# Patient Record
Sex: Female | Born: 1995 | Race: Black or African American | Hispanic: No | Marital: Single | Smoking: Never smoker
Health system: Southern US, Community
[De-identification: ages and names within clinical notes are randomized; demographics above are authoritative.]

## PROBLEM LIST (undated history)

## (undated) DIAGNOSIS — J45909 Unspecified asthma, uncomplicated: Secondary | ICD-10-CM

## (undated) DIAGNOSIS — I319 Disease of pericardium, unspecified: Secondary | ICD-10-CM

## (undated) DIAGNOSIS — J302 Other seasonal allergic rhinitis: Secondary | ICD-10-CM

## (undated) HISTORY — PX: OTHER SURGICAL HISTORY: SHX169

## (undated) HISTORY — DX: Disease of pericardium, unspecified: I31.9

---

## 2016-05-20 ENCOUNTER — Encounter (HOSPITAL_COMMUNITY): Payer: Self-pay

## 2016-05-20 ENCOUNTER — Emergency Department (HOSPITAL_COMMUNITY): Payer: Federal, State, Local not specified - PPO

## 2016-05-20 ENCOUNTER — Observation Stay (HOSPITAL_COMMUNITY)
Admission: EM | Admit: 2016-05-20 | Discharge: 2016-05-21 | Disposition: A | Discharge status: Home / Self Care | Payer: Federal, State, Local not specified - PPO | Attending: Internal Medicine | Admitting: Internal Medicine

## 2016-05-20 DIAGNOSIS — R079 Chest pain, unspecified: Secondary | ICD-10-CM

## 2016-05-20 DIAGNOSIS — I309 Acute pericarditis, unspecified: Secondary | ICD-10-CM | POA: Diagnosis not present

## 2016-05-20 DIAGNOSIS — I319 Disease of pericardium, unspecified: Secondary | ICD-10-CM

## 2016-05-20 DIAGNOSIS — D649 Anemia, unspecified: Secondary | ICD-10-CM | POA: Diagnosis not present

## 2016-05-20 DIAGNOSIS — R9431 Abnormal electrocardiogram [ECG] [EKG]: Secondary | ICD-10-CM | POA: Diagnosis present

## 2016-05-20 DIAGNOSIS — R778 Other specified abnormalities of plasma proteins: Secondary | ICD-10-CM | POA: Diagnosis not present

## 2016-05-20 DIAGNOSIS — I409 Acute myocarditis, unspecified: Secondary | ICD-10-CM

## 2016-05-20 DIAGNOSIS — R7989 Other specified abnormal findings of blood chemistry: Secondary | ICD-10-CM | POA: Diagnosis not present

## 2016-05-20 DIAGNOSIS — R1013 Epigastric pain: Secondary | ICD-10-CM | POA: Diagnosis present

## 2016-05-20 HISTORY — DX: Disease of pericardium, unspecified: I31.9

## 2016-05-20 HISTORY — DX: Unspecified asthma, uncomplicated: J45.909

## 2016-05-20 HISTORY — DX: Other seasonal allergic rhinitis: J30.2

## 2016-05-20 LAB — URINALYSIS, ROUTINE W REFLEX MICROSCOPIC
BILIRUBIN URINE: NEGATIVE
Bacteria, UA: NONE SEEN
GLUCOSE, UA: NEGATIVE mg/dL
HGB URINE DIPSTICK: NEGATIVE
Ketones, ur: 20 mg/dL — AB
LEUKOCYTES UA: NEGATIVE
NITRITE: NEGATIVE
PH: 5 (ref 5.0–8.0)
Protein, ur: 30 mg/dL — AB
Specific Gravity, Urine: 1.021 (ref 1.005–1.030)

## 2016-05-20 LAB — BASIC METABOLIC PANEL
Anion gap: 11 (ref 5–15)
BUN: 9 mg/dL (ref 6–20)
CHLORIDE: 104 mmol/L (ref 101–111)
CO2: 24 mmol/L (ref 22–32)
CREATININE: 0.82 mg/dL (ref 0.44–1.00)
Calcium: 9.2 mg/dL (ref 8.9–10.3)
GFR calc Af Amer: >60 mL/min (ref >60)
GFR calc non Af Amer: >60 mL/min (ref >60)
Glucose, Bld: 89 mg/dL (ref 65–99)
POTASSIUM: 3.3 mmol/L — AB (ref 3.5–5.1)
Sodium: 139 mmol/L (ref 135–145)

## 2016-05-20 LAB — I-STAT BETA HCG BLOOD, ED (MC, WL, AP ONLY): I-stat hCG, quantitative: <5 m[IU]/mL (ref <5)

## 2016-05-20 LAB — CBC
HEMATOCRIT: 39.8 % (ref 36.0–46.0)
HEMOGLOBIN: 13.2 g/dL (ref 12.0–15.0)
MCH: 29.5 pg (ref 26.0–34.0)
MCHC: 33.2 g/dL (ref 30.0–36.0)
MCV: 88.8 fL (ref 78.0–100.0)
PLATELETS: 240 10*3/uL (ref 150–400)
RBC: 4.48 MIL/uL (ref 3.87–5.11)
RDW: 11.9 % (ref 11.5–15.5)
WBC: 3.5 10*3/uL — AB (ref 4.0–10.5)

## 2016-05-20 LAB — D-DIMER, QUANTITATIVE: D-Dimer, Quant: 7.35 ug/mL-FEU — ABNORMAL HIGH (ref 0.00–0.50)

## 2016-05-20 LAB — I-STAT TROPONIN, ED: Troponin i, poc: 0.44 ng/mL (ref 0.00–0.08)

## 2016-05-20 LAB — MAGNESIUM: Magnesium: 2.2 mg/dL (ref 1.7–2.4)

## 2016-05-20 LAB — TROPONIN I
Troponin I: 1.12 ng/mL (ref <0.03)
Troponin I: 1.58 ng/mL (ref <0.03)

## 2016-05-20 MED ORDER — ONDANSETRON HCL 4 MG/2ML IJ SOLN
4.0000 mg | Freq: Four times a day (QID) | INTRAMUSCULAR | Status: DC | PRN
  Administered 2016-05-20: 4 mg via INTRAVENOUS
  Filled 2016-05-20: qty 2

## 2016-05-20 MED ORDER — KETOROLAC TROMETHAMINE 30 MG/ML IJ SOLN
30.0000 mg | Freq: Three times a day (TID) | INTRAMUSCULAR | Status: DC
  Administered 2016-05-20 – 2016-05-21 (×3): 30 mg via INTRAVENOUS
  Filled 2016-05-20 (×3): qty 1

## 2016-05-20 MED ORDER — ASPIRIN 81 MG PO CHEW
324.0000 mg | CHEWABLE_TABLET | Freq: Once | ORAL | Status: AC
  Administered 2016-05-20: 324 mg via ORAL
  Filled 2016-05-20: qty 4

## 2016-05-20 MED ORDER — ACETAMINOPHEN 325 MG PO TABS: 650.0000 mg | ORAL_TABLET | ORAL | Status: DC | PRN

## 2016-05-20 MED ORDER — POTASSIUM CHLORIDE CRYS ER 20 MEQ PO TBCR
40.0000 meq | EXTENDED_RELEASE_TABLET | Freq: Once | ORAL | Status: AC
  Administered 2016-05-20: 40 meq via ORAL
  Filled 2016-05-20: qty 2

## 2016-05-20 MED ORDER — IOPAMIDOL (ISOVUE-370) INJECTION 76%
INTRAVENOUS | Status: AC
  Administered 2016-05-20: 100 mL
  Filled 2016-05-20: qty 100

## 2016-05-20 MED ORDER — POTASSIUM CHLORIDE CRYS ER 20 MEQ PO TBCR
20.0000 meq | EXTENDED_RELEASE_TABLET | Freq: Two times a day (BID) | ORAL | Status: DC
  Administered 2016-05-20 – 2016-05-21 (×2): 20 meq via ORAL
  Filled 2016-05-20 (×3): qty 1

## 2016-05-20 MED ORDER — IBUPROFEN 600 MG PO TABS
600.0000 mg | ORAL_TABLET | Freq: Three times a day (TID) | ORAL | Status: DC | PRN
  Administered 2016-05-20 – 2016-05-21 (×2): 600 mg via ORAL
  Filled 2016-05-20 (×2): qty 1

## 2016-05-20 MED ORDER — SODIUM CHLORIDE 0.9 % IV SOLN
INTRAVENOUS | Status: DC
  Administered 2016-05-20 – 2016-05-21 (×3): via INTRAVENOUS

## 2016-05-20 MED ORDER — IBUPROFEN 100 MG PO CHEW
200.0000 mg | CHEWABLE_TABLET | Freq: Three times a day (TID) | ORAL | Status: DC | PRN
  Filled 2016-05-20: qty 2

## 2016-05-20 MED ORDER — ZOLPIDEM TARTRATE 5 MG PO TABS: 5.0000 mg | ORAL_TABLET | Freq: Every evening | ORAL | Status: DC | PRN

## 2016-05-20 MED ORDER — IBUPROFEN 200 MG PO TABS
200.0000 mg | ORAL_TABLET | Freq: Three times a day (TID) | ORAL | Status: DC | PRN
  Administered 2016-05-20: 200 mg via ORAL
  Filled 2016-05-20: qty 1

## 2016-05-20 MED ORDER — GI COCKTAIL ~~LOC~~
30.0000 mL | Freq: Three times a day (TID) | ORAL | Status: DC | PRN
  Administered 2016-05-20: 30 mL via ORAL
  Filled 2016-05-20: qty 30

## 2016-05-20 MED ORDER — ALPRAZOLAM 0.25 MG PO TABS: 0.2500 mg | ORAL_TABLET | Freq: Two times a day (BID) | ORAL | Status: DC | PRN

## 2016-05-20 NOTE — ED Triage Notes (Signed)
Possible pericarditis per dr Manus Gunningrancour,

## 2016-05-20 NOTE — ED Notes (Signed)
Notified provider istat troponin results

## 2016-05-20 NOTE — ED Notes (Signed)
Bedside commode at bedside. Pt attempting to give UA

## 2016-05-20 NOTE — H&P (Signed)
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 The pt says she has had a GI illness with nausea, vomiting, diarrhea, and fever for the past 4 days. It was actually getting better yesterday. This morning she woke up with epigastric "pressure-like a weight" that radiated down to her abdomin. In addition she noted a "tight" feeling in her back. In the ED her EKG was read as "acute pericarditis". Troponin poc 0.44. Her D-dimer was 7.35- chest CTA negative for PE, no pericardial effusion. The pt says her epigastric discomfort is not worse when flat and not pleuritic.   PMHx:  Past Medical History:  Diagnosis Date  . Asthma   . Seasonal allergies     No past surgical history on file.  SOCHx:  has no tobacco, alcohol, and drug history on file.  FAMHx: No FMHx of CAD  ALLERGIES: No Known Allergies  ROS: Review of Systems: General: negative for night sweats or weight changes.  Cardiovascular: negative for dyspnea on exertion, edema, orthopnea, palpitations, paroxysmal nocturnal dyspnea or shortness of breath HEENT: negative for any visual disturbances, blindness, glaucoma Dermatological: negative for rash Respiratory: negative for cough, hemoptysis, or wheezing Urologic: negative for hematuria or dysuria Abdominal: negative for bright red blood per rectum, melena, or hematemesis Neurologic: negative for visual changes, syncope, or dizziness Musculoskeletal: negative for back pain, joint pain, or swelling Psych: cooperative and appropriate All other systems reviewed and are otherwise negative except as noted above.   HOME MEDICATIONS: Prior to Admission medications   Not on File    HOSPITAL MEDICATIONS: I have reviewed the patient's current medications.  VITALS: Blood  pressure 112/73, pulse 74, temperature 98.5 F (36.9 C), temperature source Oral, resp. rate 12, last menstrual period 05/01/2016, SpO2 100 %.  PHYSICAL EXAM: General appearance: alert, cooperative, no distress and thin Neck: no carotid bruit, no JVD and thyroid feels full, not tender Lungs: clear to auscultation bilaterally Heart: regular rate and rhythm and no murmur or rub Abdomen: soft, non-tender; bowel sounds normal; no masses,  no organomegaly Extremities: extremities normal, atraumatic, no cyanosis or edema Pulses: 2+ and symmetric Skin: Skin color, texture, turgor normal. No rashes or lesions Neurologic: Grossly normal No friction rub was noted on exam. LABS: Results for orders placed or performed during the hospital encounter of 05/20/16 (from the past 24 hour(s))  Basic metabolic panel     Status: Abnormal   Collection Time: 05/20/16  6:43 AM  Result Value Ref Range   Sodium 139 135 - 145 mmol/L   Potassium 3.3 (L) 3.5 - 5.1 mmol/L   Chloride 104 101 - 111 mmol/L   CO2 24 22 - 32 mmol/L   Glucose, Bld 89 65 - 99 mg/dL   BUN 9 6 - 20 mg/dL   Creatinine, Ser 0.270.82 0.44 - 1.00 mg/dL   Calcium 9.2 8.9 - 25.310.3 mg/dL   GFR calc non Af Amer >60 >60 mL/min   GFR calc Af Amer >60 >60 mL/min   Anion gap 11 5 - 15  CBC     Status: Abnormal   Collection Time: 05/20/16  6:43 AM  Result Value Ref Range   WBC 3.5 (L) 4.0 - 10.5 K/uL   RBC 4.48 3.87 - 5.11 MIL/uL   Hemoglobin 13.2 12.0 -  15.0 g/dL   HCT 13.0 86.5 - 78.4 %   MCV 88.8 78.0 - 100.0 fL   MCH 29.5 26.0 - 34.0 pg   MCHC 33.2 30.0 - 36.0 g/dL   RDW 69.6 29.5 - 28.4 %   Platelets 240 150 - 400 K/uL  D-dimer, quantitative     Status: Abnormal   Collection Time: 05/20/16  6:44 AM  Result Value Ref Range   D-Dimer, Quant 7.35 (H) 0.00 - 0.50 ug/mL-FEU  Urinalysis, Routine w reflex microscopic     Status: Abnormal   Collection Time: 05/20/16  6:50 AM  Result Value Ref Range   Color, Urine YELLOW YELLOW   APPearance  HAZY (A) CLEAR   Specific Gravity, Urine 1.021 1.005 - 1.030   pH 5.0 5.0 - 8.0   Glucose, UA NEGATIVE NEGATIVE mg/dL   Hgb urine dipstick NEGATIVE NEGATIVE   Bilirubin Urine NEGATIVE NEGATIVE   Ketones, ur 20 (A) NEGATIVE mg/dL   Protein, ur 30 (A) NEGATIVE mg/dL   Nitrite NEGATIVE NEGATIVE   Leukocytes, UA NEGATIVE NEGATIVE   RBC / HPF 0-5 0 - 5 RBC/hpf   WBC, UA 0-5 0 - 5 WBC/hpf   Bacteria, UA NONE SEEN NONE SEEN   Squamous Epithelial / LPF 0-5 (A) NONE SEEN   Mucous PRESENT   I-stat troponin, ED     Status: Abnormal   Collection Time: 05/20/16  6:57 AM  Result Value Ref Range   Troponin i, poc 0.44 (HH) 0.00 - 0.08 ng/mL   Comment NOTIFIED PHYSICIAN    Comment 3          I-Stat beta hCG blood, ED     Status: None   Collection Time: 05/20/16  7:34 AM  Result Value Ref Range   I-stat hCG, quantitative <5.0 <5 mIU/mL   Comment 3            EKG: diffuse ST elevation, increased voltage, diffuse TWI Questionable PR depression in conjunction with a low atrial rhythm IMAGING: Dg Chest 2 View  Result Date: 05/20/2016 CLINICAL DATA:  Chest tightness and shortness of breath for several hours, initial encounter EXAM: CHEST  2 VIEW COMPARISON:  None. FINDINGS: Cardiac shadow is within normal limits. The lungs are well aerated bilaterally. Mild scoliosis concave to the left is noted in the lower thoracic spine. No other focal abnormality is seen. IMPRESSION: No active cardiopulmonary disease. Electronically Signed   By: Alcide Clever M.D.   On: 05/20/2016 07:10   Ct Angio Chest Pe W/cm &/or Wo Cm  Result Date: 05/20/2016 CLINICAL DATA:  Chest pain and shortness of breath. Probable mild cart itis. Elevated D-dimer. EXAM: CT ANGIOGRAPHY CHEST WITH CONTRAST TECHNIQUE: Multidetector CT imaging of the chest was performed using the standard protocol during bolus administration of intravenous contrast. Multiplanar CT image reconstructions and MIPs were obtained to evaluate the vascular anatomy.  CONTRAST:  100 cc Isovue 370 intravenous COMPARISON:  None. FINDINGS: Cardiovascular: Satisfactory opacification of the pulmonary arteries to the segmental level. No evidence of pulmonary embolism. Normal heart size. No pericardial effusion. Mediastinum/Nodes: No mass or adenopathy. Lungs/Pleura: There is no edema, consolidation, effusion, or pneumothorax. Rare areas of air trapping seen at the bases. Upper Abdomen: Negative Musculoskeletal: Moderate thoracic dextroscoliosis. No acute or aggressive finding Review of the MIP images confirms the above findings. IMPRESSION: 1. Negative for pulmonary embolism. Normal heart size and no pericardial effusion. 2. Moderate scoliosis. Electronically Signed   By: Marnee Spring M.D.   On: 05/20/2016 08:37  IMPRESSION: Principal Problem:   Epigastric pain Active Problems:   Troponin level elevated   D-dimer, elevated   Abnormal EKG   RECOMMENDATION: Check echo, check TSH, cycle Troponin I- MD to see.  Time Spent Directly with Patient: 30 minutes  Corine Shelter, [place]  [phone] beeper 05/20/2016, 10:11 AM   Cardiology attending  Patient seen and examined. I agree with the findings as documented above. The patient presents with chest pressure and prior diarrheal illness. She had an elevated d-dimer and a slightly elevated troponin. CT scan demonstrated no pericardial effusion and no evidence of pulmonary embolism. Her exam is notable for a healthy-appearing young woman in no distress. She does not have a friction rub. Her lungs are clear. Extremities demonstrate no edema. Abdominal exam is soft and nontender. EKG demonstrates a low atrial rhythm and very mild diffuse ST elevation which may simply be J-point elevation. Assessment and plan 1. Chest pressure - etiology is unclear. The findings of her EKG as well as the elevated troponin would suggest myopericarditis. We'll repeat her troponins and check a 2-D echo. No indication for intravenous blood  thinners. Nonsteroidals would likely be beneficial. 2. Recent diarrheal illness -  this appears to be improved. We will give her gentle hydration and encourage oral intake. There was a question of food poisoning. I expect that the underlying process leading to her diarrheal illness and the chest pressure and likely myopericarditis are all related to a viral illness.  Lewayne Bunting, M.D.

## 2016-05-20 NOTE — ED Notes (Signed)
Dr Ladona Ridgelaylor informed of critical Trop I called from lab

## 2016-05-20 NOTE — ED Notes (Signed)
Skin w/d, resp e/u. Pt appears tired. States the pain is now just pressure. PA spoke to pts parents, per pts request, via phone and updated them on poc. Movement doesn't change pts pain. Recent GI upset. Friend at bedside.

## 2016-05-20 NOTE — Progress Notes (Signed)
Attending notified of critical troponin.  No orders received.  Continue to monitor.

## 2016-05-20 NOTE — ED Provider Notes (Signed)
[facility]-EMERGENCY DEPT Provider Note   CSN: [id number] Arrival date & time: 05/20/16  0630     History   Chief Complaint Chief Complaint  Patient presents with  . Chest Pain  . Shortness of Breath    HPI Laura Hooper is a 21 y.o. female.  Laura Hooper is a 21 y.o. Female who presents to the ED complaining of chest pain and SOB that woke her up this morning. Patient reports she had a recent GI illness with diarrhea and abdominal cramping last week that has been resolving. She reports her last loose stool was yesterday morning. She reports waking up this morning with chest pain and pressure in the center of her chest and some SOB. She reports currently her chest pain is just chest pressure. She denies any changes to her symptoms with movement. Patient did recently travel from [place] last week. She is on endogenous estrogens on birth control. She denies any history of pericarditis, myocarditis or PE. No close family history or personal history of MI. She denies any history of any blood clotting disorders such as factor V Leiden, protein C or S deficiency. She denies fevers, vomiting, abdominal pain, hematochezia, leg pain, leg swelling, cough, wheezing, current shortness of breath, lightheadedness, dizziness or syncope.   The history is provided by the patient. No language interpreter was used.  Chest Pain   Associated symptoms include shortness of breath. Pertinent negatives include no abdominal pain, no back pain, no cough, no dizziness, no fever, no headaches, no nausea, no palpitations and no vomiting.  Shortness of Breath  Associated symptoms include chest pain. Pertinent negatives include no fever, no headaches, no sore throat, no neck pain, no cough, no wheezing, no vomiting, no abdominal pain, no rash and no leg swelling.    Past Medical History:  Diagnosis Date  . Asthma   . Seasonal allergies     Patient Active Problem List   Diagnosis Date Noted  . Epigastric pain 05/20/2016    . Troponin level elevated 05/20/2016  . D-dimer, elevated 05/20/2016  . Abnormal EKG 05/20/2016  . Pericarditis 05/20/2016    No past surgical history on file.  OB History    No data available       Home Medications    Prior to Admission medications   Medication Sig Start Date End Date Taking? Authorizing Provider  levonorgestrel-ethinyl estradiol (AVIANE,ALESSE,LESSINA) 0.1-20 MG-MCG tablet Take 1 tablet by mouth daily. 03/16/16 03/16/17 Yes Historical Provider, MD    Family History No family history on file.  Social History Social History  Substance Use Topics  . Smoking status: Not on file  . Smokeless tobacco: Not on file  . Alcohol use Not on file     Allergies   Patient has no known allergies.   Review of Systems Review of Systems  Constitutional: Negative for chills and fever.  HENT: Negative for congestion and sore throat.   Eyes: Negative for visual disturbance.  Respiratory: Positive for shortness of breath. Negative for cough and wheezing.   Cardiovascular: Positive for chest pain. Negative for palpitations and leg swelling.  Gastrointestinal: Positive for diarrhea (resolved. ). Negative for abdominal pain, nausea and vomiting.  Genitourinary: Negative for difficulty urinating, dysuria, frequency, hematuria, urgency, vaginal bleeding and vaginal discharge.  Musculoskeletal: Negative for back pain and neck pain.  Skin: Negative for rash.  Neurological: Negative for dizziness, syncope, light-headedness and headaches.     Physical Exam Updated Vital Signs BP 108/65 (BP Location: Right Arm)   Pulse  84   Temp 97.4 F (36.3 C) (Oral)   Resp 13   LMP 05/01/2016 (Exact Date)   SpO2 100%   Physical Exam  Constitutional: She appears well-developed and well-nourished. No distress.  Nontoxic appearing.  HENT:  Head: Normocephalic and atraumatic.  Mouth/Throat: Oropharynx is clear and moist.  Eyes: Conjunctivae are normal. Pupils are equal, round, and  reactive to light. Right eye exhibits no discharge. Left eye exhibits no discharge.  Neck: Neck supple. No JVD present.  Cardiovascular: Normal rate, regular rhythm, normal heart sounds and intact distal pulses.  Exam reveals no gallop and no friction rub.   No murmur heard. Heart rate of 72 which increases to 104 with leaning forward. No murmurs, rubs or gallops noted. Bilateral radial, posterior tibialis and dorsalis pedis pulses are intact.    Pulmonary/Chest: Effort normal and breath sounds normal. No stridor. No respiratory distress. She has no wheezes. She has no rales. She exhibits no tenderness.  Lungs clear to auscultation bilaterally. No increased work of breathing. No rales or rhonchi. No wheezing.  Abdominal: Soft. There is no tenderness. There is no guarding.  Abdomen soft and nontender to palpation.  Musculoskeletal: She exhibits no edema or tenderness.  No lower extremity edema or tenderness.  Lymphadenopathy:    She has no cervical adenopathy.  Neurological: She is alert. Coordination normal.  Skin: Skin is warm and dry. Capillary refill takes less than 2 seconds. No rash noted. She is not diaphoretic. No erythema. No pallor.  Psychiatric: She has a normal mood and affect. Her behavior is normal.  Nursing note and vitals reviewed.    ED Treatments / Results  Labs (all labs ordered are listed, but only abnormal results are displayed) Labs Reviewed  BASIC METABOLIC PANEL - Abnormal; Notable for the following:       Result Value   Potassium 3.3 (*)    All other components within normal limits  CBC - Abnormal; Notable for the following:    WBC 3.5 (*)    All other components within normal limits  URINALYSIS, ROUTINE W REFLEX MICROSCOPIC - Abnormal; Notable for the following:    APPearance HAZY (*)    Ketones, ur 20 (*)    Protein, ur 30 (*)    Squamous Epithelial / LPF 0-5 (*)    All other components within normal limits  D-DIMER, QUANTITATIVE (NOT AT [facility]) -  Abnormal; Notable for the following:    D-Dimer, Quant 7.35 (*)    All other components within normal limits  TROPONIN I - Abnormal; Notable for the following:    Troponin I 1.12 (*)    All other components within normal limits  I-STAT TROPOININ, ED - Abnormal; Notable for the following:    Troponin i, poc 0.44 (*)    All other components within normal limits  MAGNESIUM  TROPONIN I  TROPONIN I  I-STAT BETA HCG BLOOD, ED ([facility], [facility], [facility] ONLY)    EKG  EKG Interpretation  Date/Time:  Sunday May 20 2016 06:42:45 EST Ventricular Rate:  96 PR Interval:  128 QRS Duration: 74 QT Interval:  350 QTC Calculation: 442 R Axis:   91 Text Interpretation:  Unusual P axis and short PR, probable junctional tachycardia with undetermined rhythm irregularity Rightward axis Possible Acute pericarditis Abnormal ECG inverted P waves with PR depression No previous ECGs available Confirmed by LITTLE MD, RACHEL [phone]([id number]) on 05/20/2016 7:34:26 AM       Radiology Dg Chest 2 View  Result Date: 05/20/2016 CLINICAL  DATA:  Chest tightness and shortness of breath for several hours, initial encounter EXAM: CHEST  2 VIEW COMPARISON:  None. FINDINGS: Cardiac shadow is within normal limits. The lungs are well aerated bilaterally. Mild scoliosis concave to the left is noted in the lower thoracic spine. No other focal abnormality is seen. IMPRESSION: No active cardiopulmonary disease. Electronically Signed   By: Alcide Clever M.D.   On: 05/20/2016 07:10   Ct Angio Chest Pe W/cm &/or Wo Cm  Result Date: 05/20/2016 CLINICAL DATA:  Chest pain and shortness of breath. Probable mild cart itis. Elevated D-dimer. EXAM: CT ANGIOGRAPHY CHEST WITH CONTRAST TECHNIQUE: Multidetector CT imaging of the chest was performed using the standard protocol during bolus administration of intravenous contrast. Multiplanar CT image reconstructions and MIPs were obtained to evaluate the vascular anatomy. CONTRAST:  100 cc Isovue 370 intravenous  COMPARISON:  None. FINDINGS: Cardiovascular: Satisfactory opacification of the pulmonary arteries to the segmental level. No evidence of pulmonary embolism. Normal heart size. No pericardial effusion. Mediastinum/Nodes: No mass or adenopathy. Lungs/Pleura: There is no edema, consolidation, effusion, or pneumothorax. Rare areas of air trapping seen at the bases. Upper Abdomen: Negative Musculoskeletal: Moderate thoracic dextroscoliosis. No acute or aggressive finding Review of the MIP images confirms the above findings. IMPRESSION: 1. Negative for pulmonary embolism. Normal heart size and no pericardial effusion. 2. Moderate scoliosis. Electronically Signed   By: Marnee Spring M.D.   On: 05/20/2016 08:37    Procedures Procedures (including critical care time)  CRITICAL CARE Performed by: Lawana Chambers   Total critical care time: 45 minutes  Critical care time was exclusive of separately billable procedures and treating other patients.  Critical care was necessary to treat or prevent imminent or life-threatening deterioration.  Critical care was time spent personally by me on the following activities: development of treatment plan with patient and/or surrogate as well as nursing, discussions with consultants, evaluation of patient's response to treatment, examination of patient, obtaining history from patient or surrogate, ordering and performing treatments and interventions, ordering and review of laboratory studies, ordering and review of radiographic studies, pulse oximetry and re-evaluation of patient's condition.   Medications Ordered in ED Medications  acetaminophen (TYLENOL) tablet 650 mg (not administered)  ondansetron (ZOFRAN) injection 4 mg (not administered)  0.9 %  sodium chloride infusion ( Intravenous New Bag/Given 05/20/16 1507)  ALPRAZolam (XANAX) tablet 0.25 mg (not administered)  zolpidem (AMBIEN) tablet 5 mg (not administered)  ibuprofen (ADVIL,MOTRIN) chewable  tablet 200 mg (not administered)  gi cocktail (Maalox,Lidocaine,Donnatal) (30 mLs Oral Given 05/20/16 1328)  aspirin chewable tablet 324 mg (324 mg Oral Given 05/20/16 1010)  iopamidol (ISOVUE-370) 76 % injection (100 mLs  Contrast Given 05/20/16 0820)  potassium chloride SA (K-DUR,KLOR-CON) CR tablet 40 mEq (40 mEq Oral Given 05/20/16 1328)     Initial Impression / Assessment and Plan / ED Course  I have reviewed the triage vital signs and the nursing notes.  Pertinent labs & imaging results that were available during my care of the patient were reviewed by me and considered in my medical decision making (see chart for details).  Clinical Course    This is a 21 y.o. Female who presents to the ED complaining of chest pain and SOB that woke her up this morning. Patient reports she had a recent GI illness with diarrhea and abdominal cramping last week that has been resolving. She reports her last loose stool was yesterday morning. She reports waking up this morning with chest pain and  pressure in the center of her chest and some SOB. She reports currently her chest pain is just chest pressure. She denies any changes to her symptoms with movement. Patient did recently travel from [place] last week. She is on endogenous estrogens on birth control.   On exam the patient is afebrile and nontoxic appearing. She is no tachypnea, hypoxia or tachycardia. Lungs clear to auscultation bilaterally. She appears somewhat uncomfortable and leaning forward. EKG shows diffuse ST elevation with inverted p waves. I-STAT troponin returned elevated at 0.44.  Concern for myocarditis, pericarditis or pulmonary embolism. Cardiology consulted. Not concerned for STEMI.   Chest x-ray is unremarkable.  BMP and CBC are unremarkable. D-dimer elevated at 7.35. Will obtain CT angiogram of her chest to rule out PE.  . CT angiogram of the chest shows no pulmonary embolism. Normal heart size. No pericardial effusion.   Cardiology saw  patient and will admit. Patient agrees with plan for admission.  This patient was discussed with and evaluated by Dr. Manus Gunning who agrees with assessment and plan.      Final Clinical Impressions(s) / ED Diagnoses   Final diagnoses:  Acute pericarditis, unspecified type  Acute myocarditis, unspecified myocarditis type    New Prescriptions Current Discharge Medication List       Everlene Farrier, PA-C 05/20/16 1557    Glynn Octave, MD 05/20/16 1725

## 2016-05-20 NOTE — ED Triage Notes (Signed)
Pt here for chest pain and sob since waking up this, sts pain woke from sleep.

## 2016-05-20 NOTE — ED Notes (Signed)
Cardiology in for eval.  

## 2016-05-20 NOTE — ED Notes (Signed)
Dr Ladona Ridgelaylor, cardiology, at bedside speaking with pt and speaking via phone with pts mother

## 2016-05-20 NOTE — ED Notes (Signed)
Pt returned from CT. Pt on monitor. Temp 98.5

## 2016-05-21 ENCOUNTER — Observation Stay (HOSPITAL_BASED_OUTPATIENT_CLINIC_OR_DEPARTMENT_OTHER): Payer: Federal, State, Local not specified - PPO

## 2016-05-21 DIAGNOSIS — D649 Anemia, unspecified: Secondary | ICD-10-CM | POA: Diagnosis not present

## 2016-05-21 DIAGNOSIS — R9431 Abnormal electrocardiogram [ECG] [EKG]: Secondary | ICD-10-CM | POA: Diagnosis not present

## 2016-05-21 DIAGNOSIS — I309 Acute pericarditis, unspecified: Secondary | ICD-10-CM | POA: Diagnosis not present

## 2016-05-21 DIAGNOSIS — R1013 Epigastric pain: Secondary | ICD-10-CM

## 2016-05-21 DIAGNOSIS — R778 Other specified abnormalities of plasma proteins: Secondary | ICD-10-CM | POA: Diagnosis not present

## 2016-05-21 DIAGNOSIS — R079 Chest pain, unspecified: Secondary | ICD-10-CM | POA: Diagnosis not present

## 2016-05-21 LAB — CBC
HCT: 34.3 % — ABNORMAL LOW (ref 36.0–46.0)
Hemoglobin: 11.5 g/dL — ABNORMAL LOW (ref 12.0–15.0)
MCH: 29.9 pg (ref 26.0–34.0)
MCHC: 33.5 g/dL (ref 30.0–36.0)
MCV: 89.3 fL (ref 78.0–100.0)
Platelets: 207 10*3/uL (ref 150–400)
RBC: 3.84 MIL/uL — ABNORMAL LOW (ref 3.87–5.11)
RDW: 12 % (ref 11.5–15.5)
WBC: 3.6 10*3/uL — ABNORMAL LOW (ref 4.0–10.5)

## 2016-05-21 LAB — BASIC METABOLIC PANEL
Anion gap: 6 (ref 5–15)
BUN: 9 mg/dL (ref 6–20)
CO2: 21 mmol/L — ABNORMAL LOW (ref 22–32)
Calcium: 8.2 mg/dL — ABNORMAL LOW (ref 8.9–10.3)
Chloride: 111 mmol/L (ref 101–111)
Creatinine, Ser: 0.69 mg/dL (ref 0.44–1.00)
GFR calc Af Amer: >60 mL/min (ref >60)
GFR calc non Af Amer: >60 mL/min (ref >60)
Glucose, Bld: 90 mg/dL (ref 65–99)
Potassium: 4.6 mmol/L (ref 3.5–5.1)
Sodium: 138 mmol/L (ref 135–145)

## 2016-05-21 LAB — TROPONIN I: TROPONIN I: 0.97 ng/mL — AB (ref <0.03)

## 2016-05-21 LAB — ECHOCARDIOGRAM COMPLETE
Height: 64 in
WEIGHTICAEL: 1894.4 [oz_av]

## 2016-05-21 LAB — TSH: TSH: 3.056 u[IU]/mL (ref 0.350–4.500)

## 2016-05-21 MED ORDER — PANTOPRAZOLE SODIUM 20 MG PO TBEC: 20.0000 mg | DELAYED_RELEASE_TABLET | Freq: Every day | ORAL | 0 refills | Status: AC

## 2016-05-21 MED ORDER — COLCHICINE 0.6 MG PO TABS: 0.6000 mg | ORAL_TABLET | Freq: Two times a day (BID) | ORAL | 2 refills | Status: AC

## 2016-05-21 MED ORDER — IBUPROFEN 600 MG PO TABS: 600.0000 mg | ORAL_TABLET | Freq: Three times a day (TID) | ORAL | 0 refills | Status: AC

## 2016-05-21 NOTE — Progress Notes (Signed)
Patient discharged teaching given including activity, diet, follow-up appointments and medication. Patient verbalized understanding of all discharge instructions. IV access was dc'd. Vitals are stable. Skin is intact. Pt to be escorted out by RN, to be driven home by friends.

## 2016-05-21 NOTE — Progress Notes (Signed)
Preliminary results by tech - Venous Duplex Lower Ext. Completed. Negative for deep and superficial vein thrombosis in both legs.  Chimamanda Siegfried, BS, RDMS, RVT  

## 2016-05-21 NOTE — Progress Notes (Signed)
  Echocardiogram 2D Echocardiogram has been performed.  Laura Hooper, Laura Hooper 05/21/2016, 9:37 AM

## 2016-05-21 NOTE — Progress Notes (Signed)
Patient Name: Laura Hooper Date of Encounter: 05/21/2016  Primary Cardiologist:   Hospital Problem List     Principal Problem:   Epigastric pain Active Problems:   Troponin level elevated   D-dimer, elevated   Abnormal EKG   Pericarditis     Subjective   Still with some chest pain requiring Toradol.  Probably home later today.  I would like for her to walk in the hallway.    Inpatient Medications    Scheduled Meds: . ketorolac  30 mg Intravenous Q8H  . potassium chloride  20 mEq Oral BID   Continuous Infusions: . sodium chloride 75 mL/hr at 05/21/16 0356   PRN Meds: acetaminophen, ALPRAZolam, gi cocktail, ibuprofen, ondansetron (ZOFRAN) IV, zolpidem   Vital Signs    Vitals:   05/20/16 1424 05/20/16 1608 05/20/16 2011 05/21/16 0510  BP: 108/65  (!) 109/58 109/65  Pulse: 84  77 75  Resp:   18 18  Temp: 97.4 F (36.3 C)  98.2 F (36.8 C) 97.7 F (36.5 C)  TempSrc: Oral  Oral Oral  SpO2: 100%  100% 100%  Weight:  118 lb 6.4 oz (53.7 kg)    Height:  5\' 4"  (1.626 m)      Intake/Output Summary (Last 24 hours) at 05/21/16 0857 Last data filed at 05/20/16 1800  Gross per 24 hour  Intake             2465 ml  Output                0 ml  Net             2465 ml   Filed Weights   05/20/16 1608  Weight: 118 lb 6.4 oz (53.7 kg)    Physical Exam    GEN: NAD.  Neck:  no JVD Cardiac: Regular Rate and Rhythm, no murmurs, rubs, or gallops.  No edema.  Radials/DP/PT 2+  and equal bilaterally.  Respiratory:  Respirations  regular and unlabored, clear to auscultation bilaterally. GI: Soft, nontender, nondistended, BS + x 4. Skin: warm and dry, no rash. Neuro:   Strength and sensation are intact. Psych:  AAOx3.  Normal affect.  Labs    CBC  Recent Labs  05/20/16 0643 05/21/16 0236  WBC 3.5* 3.6*  HGB 13.2 11.5*  HCT 39.8 34.3*  MCV 88.8 89.3  PLT 240 207   Basic Metabolic Panel  Recent Labs  05/20/16 0643 05/20/16 1001 05/21/16 0236  NA 139  --   138  K 3.3*  --  4.6  CL 104  --  111  CO2 24  --  21*  GLUCOSE 89  --  90  BUN 9  --  9  CREATININE 0.82  --  0.69  CALCIUM 9.2  --  8.2*  MG  --  2.2  --    Liver Function Tests No results for input(s): AST, ALT, ALKPHOS, BILITOT, PROT, ALBUMIN in the last 72 hours. No results for input(s): LIPASE, AMYLASE in the last 72 hours. Cardiac Enzymes  Recent Labs  05/20/16 1001 05/20/16 1553 05/21/16 0236  TROPONINI 1.12* 1.58* 0.97*   BNP Invalid input(s): POCBNP D-Dimer  Recent Labs  05/20/16 0644  DDIMER 7.35*   Hemoglobin A1C No results for input(s): HGBA1C in the last 72 hours. Fasting Lipid Panel No results for input(s): CHOL, HDL, LDLCALC, TRIG, CHOLHDL, LDLDIRECT in the last 72 hours. Thyroid Function Tests  Recent Labs  05/21/16 0236  TSH 3.056  Telemetry    Sinus, sinus tach.  Cannot exclude ectopic atrial tach.  - Personally Reviewed  ECG    NSR, rate 68, no acute ST T wave changes.  Consistent with early repolarization.  - Personally Reviewed  Radiology    Dg Chest 2 View  Result Date: 05/20/2016 CLINICAL DATA:  Chest tightness and shortness of breath for several hours, initial encounter EXAM: CHEST  2 VIEW COMPARISON:  None. FINDINGS: Cardiac shadow is within normal limits. The lungs are well aerated bilaterally. Mild scoliosis concave to the left is noted in the lower thoracic spine. No other focal abnormality is seen. IMPRESSION: No active cardiopulmonary disease. Electronically Signed   By: Alcide Clever M.D.   On: 05/20/2016 07:10   Ct Angio Chest Pe W/cm &/or Wo Cm  Result Date: 05/20/2016 CLINICAL DATA:  Chest pain and shortness of breath. Probable mild cart itis. Elevated D-dimer. EXAM: CT ANGIOGRAPHY CHEST WITH CONTRAST TECHNIQUE: Multidetector CT imaging of the chest was performed using the standard protocol during bolus administration of intravenous contrast. Multiplanar CT image reconstructions and MIPs were obtained to evaluate the  vascular anatomy. CONTRAST:  100 cc Isovue 370 intravenous COMPARISON:  None. FINDINGS: Cardiovascular: Satisfactory opacification of the pulmonary arteries to the segmental level. No evidence of pulmonary embolism. Normal heart size. No pericardial effusion. Mediastinum/Nodes: No mass or adenopathy. Lungs/Pleura: There is no edema, consolidation, effusion, or pneumothorax. Rare areas of air trapping seen at the bases. Upper Abdomen: Negative Musculoskeletal: Moderate thoracic dextroscoliosis. No acute or aggressive finding Review of the MIP images confirms the above findings. IMPRESSION: 1. Negative for pulmonary embolism. Normal heart size and no pericardial effusion. 2. Moderate scoliosis. Electronically Signed   By: Marnee Spring M.D.   On: 05/20/2016 08:37    Cardiac Studies   Echo final report pending.   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   Troponin was elevated and EKG consistent with pericarditis.   Assessment & Plan    ACUTE PERICARDITIS:    Elevated troponin trending down.  Presumptive diagnosis is pericarditis.  Lower extremity ultrasound is pending.  However, los suspicion for DVT.  Low suspicion for ACS.   Home this afternoon if feeling better and results are final and negative.   ANEMIA:  Mild and can follow as an out patient.   Signed, Rollene Rotunda, MD  05/21/2016, 8:57 AM

## 2016-05-21 NOTE — Progress Notes (Signed)
Patient ambulated >500 ft and tolerated it well.

## 2016-05-21 NOTE — Discharge Summary (Signed)
Discharge Summary    Patient ID: Laura Hooper,  MRN: [id number], DOB/AGE: 05-Jul-1995 21 y.o.  Admit date: 05/20/2016 Discharge date: 05/21/2016  Primary Care Provider: Pcp Not In System Primary Cardiologist: Dr. Antoine Poche   Discharge Diagnoses    Principal Problem:   Epigastric pain Active Problems:   Troponin level elevated   D-dimer, elevated   Abnormal EKG   Pericarditis   Allergies No Known Allergies  Diagnostic Studies/Procedures     2D Echo 05/20/16 Study Conclusions  - Left ventricle: Global longitudinal strain -21% The cavity size   was normal. Systolic function was normal. The estimated ejection   fraction was in the range of 60% to 65%.   Pericardium:  There was no pericardial effusion.  CT Angio 05/20/16 IMPRESSION: 1. Negative for pulmonary embolism. Normal heart size and no pericardial effusion. 2. Moderate scoliosis.  LE Venous Doppler 05/21/16 Summary:  - No evidence of deep vein or superficial thrombosis involving the   right lower extremity and left lower extremity. - No evidence of Baker&'s cyst on the right or left.   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 The pt says she has had a GI illness with nausea, vomiting, diarrhea, and fever for the past 4 days. It was actually getting better yesterday. This morning she woke up with epigastric "pressure-like a weight" that radiated down to her abdomin. In addition she noted a "tight" feeling in her back. In the ED her EKG was read as "acute pericarditis". Troponin poc 0.44. Her D-dimer was 7.35- chest CTA negative for PE, no pericardial effusion. The pt says her epigastric discomfort is not worse when flat and not pleuritic.   Hospital Course     Pt was admitted to telemetry and treated for presumed acute pericarditis. Troponin's were cycled and demonstrated a downward trend. There was low  suspicion for ACS. Bilateral LE venous dopplers were negative for DVT. She had improvement in pain with IV Toradol. She was last seen and examined by Dr. Holly Lions, who determined she was stable for discharge home. She was given an Rx for Ibuprofen and colchicine and a 4 week supply of Protonix for GI protection. She will f/u in our office with an APP in 3-4 weeks for repeat assessment. She was instructed to take Ibuprofen with food.   Consultants: none    Discharge Vitals Blood pressure 98/63, pulse 79, temperature 98.1 F (36.7 C), temperature source Oral, resp. rate 18, height 5\' 4"  (1.626 m), weight 118 lb 6.4 oz (53.7 kg), last menstrual period 05/01/2016, SpO2 100 %.  Filed Weights   05/20/16 1608  Weight: 118 lb 6.4 oz (53.7 kg)    Labs & Radiologic Studies    CBC  Recent Labs  05/20/16 0643 05/21/16 0236  WBC 3.5* 3.6*  HGB 13.2 11.5*  HCT 39.8 34.3*  MCV 88.8 89.3  PLT 240 207   Basic Metabolic Panel  Recent Labs  05/20/16 0643 05/20/16 1001 05/21/16 0236  NA 139  --  138  K 3.3*  --  4.6  CL 104  --  111  CO2 24  --  21*  GLUCOSE 89  --  90  BUN 9  --  9  CREATININE 0.82  --  0.69  CALCIUM 9.2  --  8.2*  MG  --  2.2  --    Liver  Function Tests No results for input(s): AST, ALT, ALKPHOS, BILITOT, PROT, ALBUMIN in the last 72 hours. No results for input(s): LIPASE, AMYLASE in the last 72 hours. Cardiac Enzymes  Recent Labs  05/20/16 1001 05/20/16 1553 05/21/16 0236  TROPONINI 1.12* 1.58* 0.97*   BNP Invalid input(s): POCBNP D-Dimer  Recent Labs  05/20/16 0644  DDIMER 7.35*   Hemoglobin A1C No results for input(s): HGBA1C in the last 72 hours. Fasting Lipid Panel No results for input(s): CHOL, HDL, LDLCALC, TRIG, CHOLHDL, LDLDIRECT in the last 72 hours. Thyroid Function Tests  Recent Labs  05/21/16 0236  TSH 3.056   _____________  Dg Chest 2 View  Result Date: 05/20/2016 CLINICAL DATA:  Chest tightness and shortness of breath for  several hours, initial encounter EXAM: CHEST  2 VIEW COMPARISON:  None. FINDINGS: Cardiac shadow is within normal limits. The lungs are well aerated bilaterally. Mild scoliosis concave to the left is noted in the lower thoracic spine. No other focal abnormality is seen. IMPRESSION: No active cardiopulmonary disease. Electronically Signed   By: Alcide Clever M.D.   On: 05/20/2016 07:10   Ct Angio Chest Pe W/cm &/or Wo Cm  Result Date: 05/20/2016 CLINICAL DATA:  Chest pain and shortness of breath. Probable mild cart itis. Elevated D-dimer. EXAM: CT ANGIOGRAPHY CHEST WITH CONTRAST TECHNIQUE: Multidetector CT imaging of the chest was performed using the standard protocol during bolus administration of intravenous contrast. Multiplanar CT image reconstructions and MIPs were obtained to evaluate the vascular anatomy. CONTRAST:  100 cc Isovue 370 intravenous COMPARISON:  None. FINDINGS: Cardiovascular: Satisfactory opacification of the pulmonary arteries to the segmental level. No evidence of pulmonary embolism. Normal heart size. No pericardial effusion. Mediastinum/Nodes: No mass or adenopathy. Lungs/Pleura: There is no edema, consolidation, effusion, or pneumothorax. Rare areas of air trapping seen at the bases. Upper Abdomen: Negative Musculoskeletal: Moderate thoracic dextroscoliosis. No acute or aggressive finding Review of the MIP images confirms the above findings. IMPRESSION: 1. Negative for pulmonary embolism. Normal heart size and no pericardial effusion. 2. Moderate scoliosis. Electronically Signed   By: Marnee Spring M.D.   On: 05/20/2016 08:37   Disposition   Pt is being discharged home today in good condition.  Follow-up Plans & Appointments    Follow-up Information    Rollene Rotunda, MD Follow up.   Specialty:  Cardiology Why:  our office will call you with a hospital follow-up appointment  Contact information: [address] [place] [address] [phone]           Discharge Instructions    Diet - low sodium heart healthy    Complete by:  As directed    Increase activity slowly    Complete by:  As directed       Discharge Medications   Current Discharge Medication List    START taking these medications   Details  colchicine 0.6 MG tablet Take 1 tablet (0.6 mg total) by mouth 2 (two) times daily. Qty: 60 tablet, Refills: 2    ibuprofen (ADVIL,MOTRIN) 600 MG tablet Take 1 tablet (600 mg total) by mouth 3 (three) times daily with meals. Qty: 90 tablet, Refills: 0    pantoprazole (PROTONIX) 20 MG tablet Take 1 tablet (20 mg total) by mouth daily. Qty: 30 tablet, Refills: 0      CONTINUE these medications which have NOT CHANGED   Details  levonorgestrel-ethinyl estradiol (AVIANE,ALESSE,LESSINA) 0.1-20 MG-MCG tablet Take 1 tablet by mouth daily.  Outstanding Labs/Studies   None   Duration of Discharge Encounter   Greater than 30 minutes including physician time.  Signed, Robbie LisBrittainy Simmons PA-C 05/21/2016, 3:42 PM  Patient seen and examined.  Plan as discussed in my rounding note for today and outlined above. Laura Hooper  05/21/2016  4:00 PM

## 2016-06-15 ENCOUNTER — Encounter: Payer: Self-pay | Admitting: Physician Assistant

## 2016-06-15 ENCOUNTER — Ambulatory Visit (INDEPENDENT_AMBULATORY_CARE_PROVIDER_SITE_OTHER): Payer: Federal, State, Local not specified - PPO | Admitting: Physician Assistant

## 2016-06-15 VITALS — BP 109/69 | HR 81 | Ht 64.0 in | Wt 120.4 lb

## 2016-06-15 DIAGNOSIS — I309 Acute pericarditis, unspecified: Secondary | ICD-10-CM

## 2016-06-15 NOTE — Patient Instructions (Signed)
Medication Instructions -- Finish Ibuprofen  -- Take Colchicine for ONE MONTH  -- OK to decrease to once daily   Avoid strenuous exercise for TWO WEEKS Avoid alcohol, tobacco, drugs  Please contact our office if you have recurrent chest pain  Your physician recommends that you schedule a follow-up appointment AS NEEDED

## 2016-06-15 NOTE — Progress Notes (Signed)
Cardiology Office Note   Date:  06/15/2016   ID:  Laura Hooper, DOB 1995/09/08, MRN [id number]  PCP:  Pcp Not In System  Cardiologist:  Dr Luna KitchensHochrein  Orest Dygert, PA-C   Chief Complaint  Patient presents with  . Follow-up    Pt states no Sx.     History of Present Illness: Laura Hooper is a 21 y.o. female with a history of seasonal allergies  Admit 01/07-01/08 with pericarditis, she was evaluated for PE because her d-dimer was abnormal but it was negative, troponin 1.58. Discharged on ibuprofen and colchicine plus Protonix  Laura Hooper presents for post-hospital follow up. Her father is with her today.  For the first couple of weeks after discharge, she had to take the Ibuprofen on schedule (with food) to prevent the pain. However, the pain has gotten better and she can take the medication any time she eats. She has been compliant with taking the Ibuprofen tid. She has not taken the colchicine or the Protonix.  She has been walking a lot per her usual activity level as a student, but has not been exercising much.   She Has not had any chest pain with exertion. She has not had any problems with shortness of breath. She has not had lower extremity edema, orthopnea or PND.  She reports a GI illness just prior to the pericarditis. Patient states she was getting over the GI illness and the symptoms had greatly improved when she began getting chest pain.   Past Medical History:  Diagnosis Date  . Asthma   . Pericarditis 05/20/2016  . Seasonal allergies     Past Surgical History:  Procedure Laterality Date  . None      Current Outpatient Prescriptions  Medication Sig Dispense Refill  . colchicine 0.6 MG tablet Take 1 tablet (0.6 mg total) by mouth 2 (two) times daily. 60 tablet 2  . ibuprofen (ADVIL,MOTRIN) 600 MG tablet Take 1 tablet (600 mg total) by mouth 3 (three) times daily with meals. 90 tablet 0  . levonorgestrel-ethinyl estradiol (AVIANE,ALESSE,LESSINA) 0.1-20  MG-MCG tablet Take 1 tablet by mouth daily.    . pantoprazole (PROTONIX) 20 MG tablet Take 1 tablet (20 mg total) by mouth daily. 30 tablet 0   No current facility-administered medications for this visit.     Allergies:   Patient has no known allergies.    Social History:  The patient  reports that she has never smoked. She has never used smokeless tobacco.   Family History:  The patient's family history is not on file.    ROS:  Please see the history of present illness. All other systems are reviewed and negative.    PHYSICAL EXAM: VS:  BP 109/69   Pulse 81   Ht 5\' 4"  (1.626 m)   Wt 120 lb 6.4 oz (54.6 kg)   BMI 20.67 kg/m  , BMI Body mass index is 20.67 kg/m. GEN: Well nourished, well developed, female in no acute distress  HEENT: normal for age  Neck: no JVD, no carotid bruit, no masses Cardiac: RRR; no murmur, no rubs, or gallops Respiratory:  clear to auscultation bilaterally, normal work of breathing GI: soft, nontender, nondistended, + BS MS: no deformity or atrophy; no edema; distal pulses are 2+ in all 4 extremities   Skin: warm and dry, no rash Neuro:  Strength and sensation are intact Psych: euthymic mood, full affect   EKG:  EKG is not ordered today.  2D Echo 05/20/16 Study  Conclusions - Left ventricle: Global longitudinal strain -21% The cavity size was normal. Systolic function was normal. The estimated ejection fraction was in the range of 60% to 65%.  Pericardium: There was no pericardial effusion.  CT Angio 05/20/16 IMPRESSION: 1. Negative for pulmonary embolism. Normal heart size and no pericardial effusion. 2. Moderate scoliosis.  LE Venous Doppler 05/21/16 Summary: - No evidence of deep vein or superficial thrombosis involving the right lower extremity and left lower extremity. - No evidence of Baker&'s cyst on the right or left.   Recent Labs: 05/20/2016: Magnesium 2.2 05/21/2016: BUN 9; Creatinine, Ser 0.69; Hemoglobin 11.5;  Platelets 207; Potassium 4.6; Sodium 138; TSH 3.056    Lipid Panel No results found for: CHOL, TRIG, HDL, CHOLHDL, VLDL, LDLCALC, LDLDIRECT   Wt Readings from Last 3 Encounters:  06/15/16 120 lb 6.4 oz (54.6 kg)  05/20/16 118 lb 6.4 oz (53.7 kg)     Other studies Reviewed: Additional studies/ records that were reviewed today include: Hospital records and testing.  ASSESSMENT AND PLAN:  1.  Para carditis: Likely brought on by preceding illness. Her symptoms have greatly improved on the ibuprofen, complete this. She is not currently taking the colchicine, but I requested that she take colchicine for month after the ibuprofen is completed, to minimize the chances that the pericarditis will return.  Reviewed the echo results and other results with the patient and her father. Advised that the elevated cardiac enzymes were not consistent with coronary artery disease in her case, this was felt because of the pericarditis. Heart muscle function was normal and there was no pericardial effusion.  Advised that she needs to watch for recurrence, but if she is asymptomatic, okay to start increasing her activity level in 2 weeks.   Current medicines are reviewed at length with the patient today.  The patient does not have concerns regarding medicines.  The following changes have been made:  Okay to stay off the Protonix if no GI issues, completely ibuprofen, take colchicine for a month  Labs/ tests ordered today include:  No orders of the defined types were placed in this encounter.    Disposition:   FU with Dr. Antoine Poche when necessary  Signed, Leanna Battles  06/15/2016 9:54 AM    [facility] Phone: [phone]; Fax: [phone]  This note was written with the assistance of speech recognition software. Please excuse any transcriptional errors.

## 2016-06-19 ENCOUNTER — Ambulatory Visit: Payer: Self-pay | Admitting: Cardiology

## 2016-07-11 ENCOUNTER — Telehealth: Payer: Self-pay | Admitting: Physician Assistant

## 2016-07-11 NOTE — Telephone Encounter (Signed)
Pt was told to call if chest pressure returns. Pt is also barasking if pericarditis is recurrent or a chronic condition? (spoke with DOD states that this can be recurrent). Pt denies any other symptoms, chest pain or pressure, shortness of breath, edema, no lightheaded or dizziness, nausea. Pt informed to go to the ER if symptoms persist, worsen, or any new symptoms develop verbalizes understanding. Pt wants to forward message to Theodore Demarkhonda Barrett for review.

## 2016-07-11 NOTE — Telephone Encounter (Signed)
Laura Hooper is calling because she is having some discomfort in her chest area and was told to call back if that happens. She is taking Colchicine .6mg  . Please call

## 2016-07-13 NOTE — Telephone Encounter (Signed)
Continue current therapy, call for symptoms.  Thanks

## 2016-07-13 NOTE — Telephone Encounter (Signed)
Spoke with pt, she has had no further episodes of chest pain. She was fine by the next day. Nothing needed at this time.

## 2016-07-16 NOTE — Telephone Encounter (Signed)
noted 

## 2018-11-07 IMAGING — CT CT ANGIO CHEST
2 of 6 series · 18 of 36 positions shown · IV contrast (Omni 300)
Comparison: None.

CLINICAL DATA: Chest pain and shortness of breath. Probable mild
cart itis. Elevated D-dimer.

EXAM:
CT ANGIOGRAPHY CHEST WITH CONTRAST
TECHNIQUE: Multidetector CT imaging of the chest was performed using the
standard protocol during bolus administration of intravenous
contrast. Multiplanar CT image reconstructions and MIPs were
obtained to evaluate the vascular anatomy.
CONTRAST:  100 cc Isovue 370 intravenous

[Series 8: pe thins · axial · 0.64mm/px · z∈[-143,+114]mm · 17 of 291 slices shown]
[im 17/291  lung]
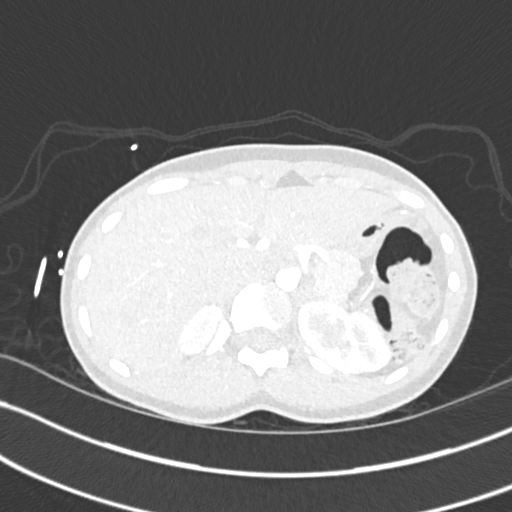
[im 33/291  mediastinal]
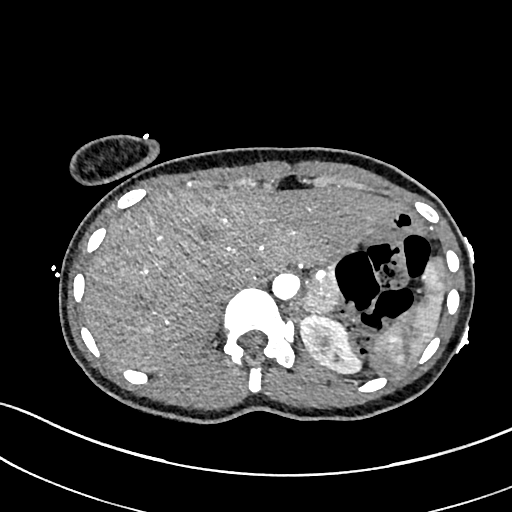
[im 49/291  lung]
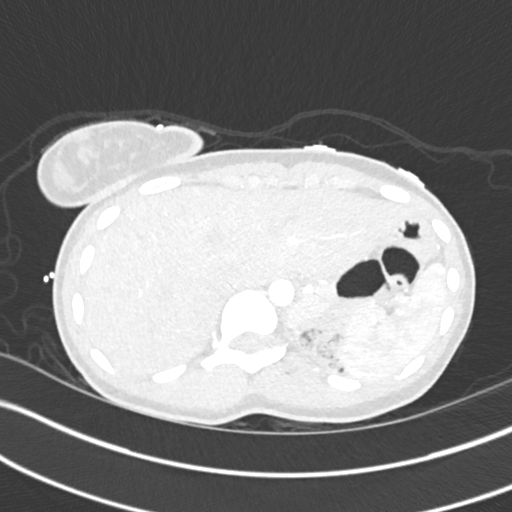
[im 65/291  mediastinal]
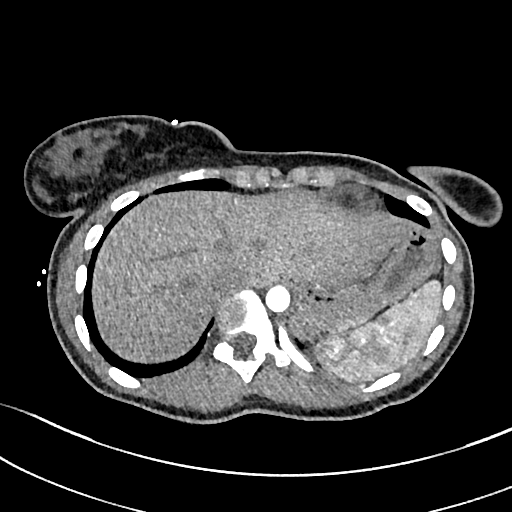
[im 81/291  lung]
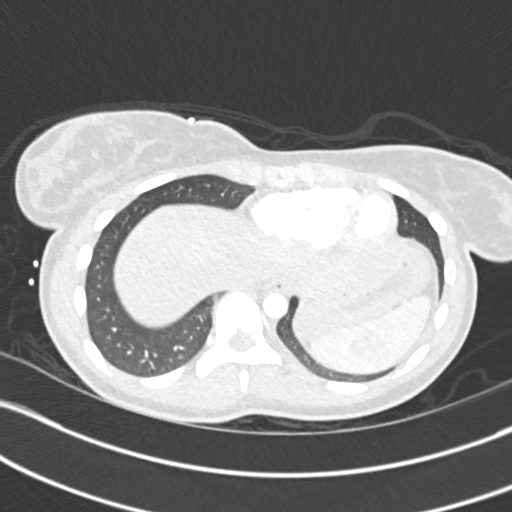
[im 97/291  mediastinal]
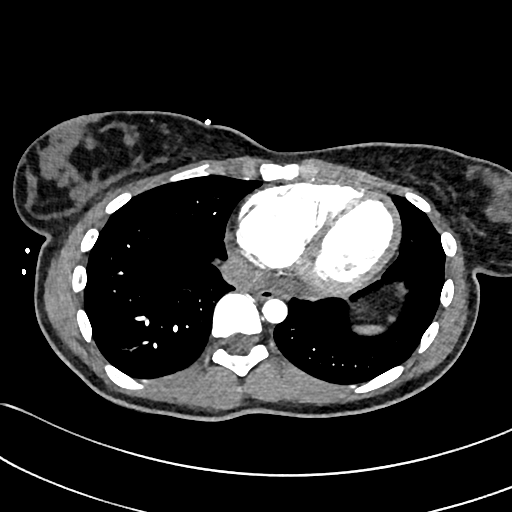
[im 113/291  lung]
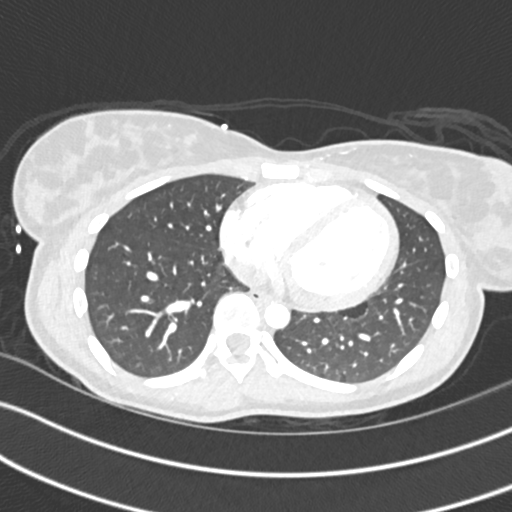
[im 129/291  mediastinal]
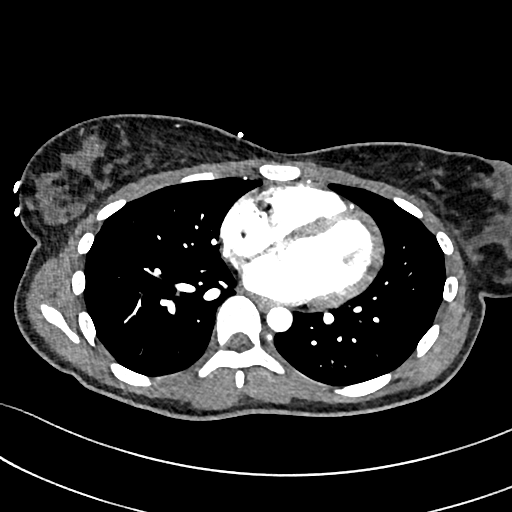
[im 146/291  lung]
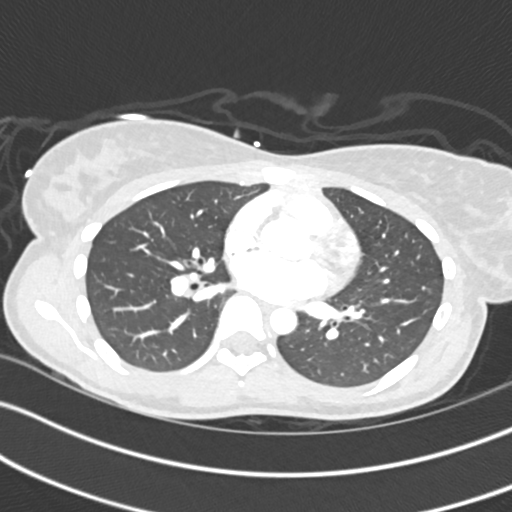
[im 162/291  mediastinal]
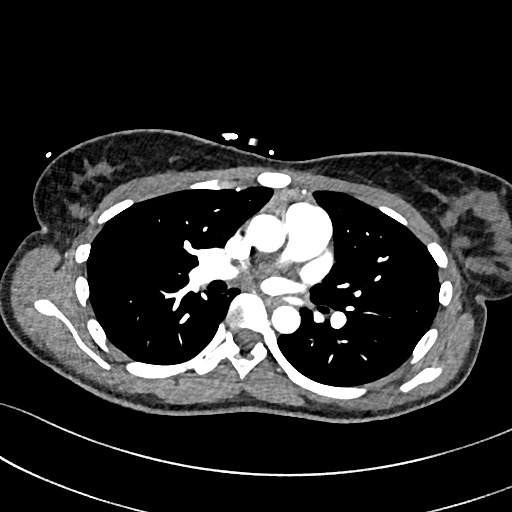
[im 178/291  lung]
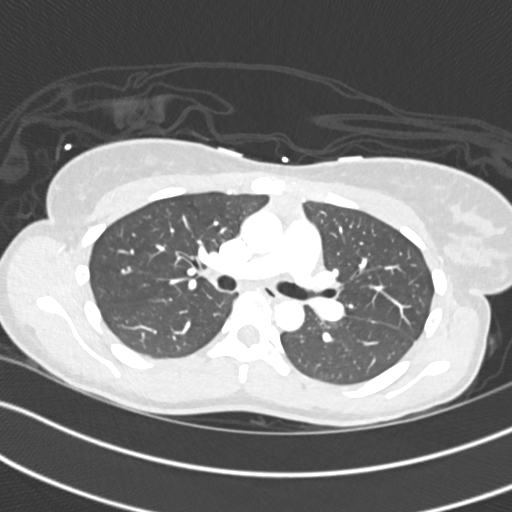
[im 194/291  mediastinal]
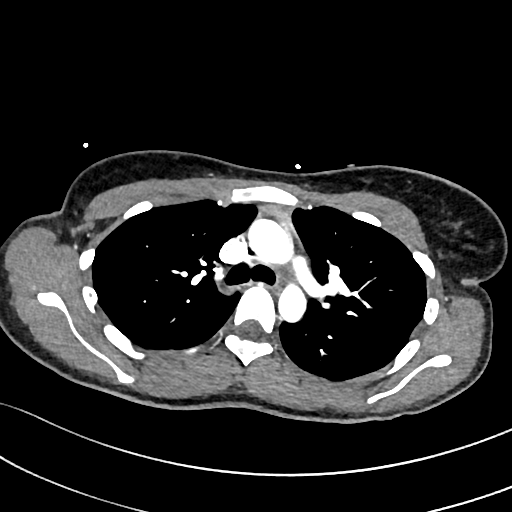
[im 210/291  lung]
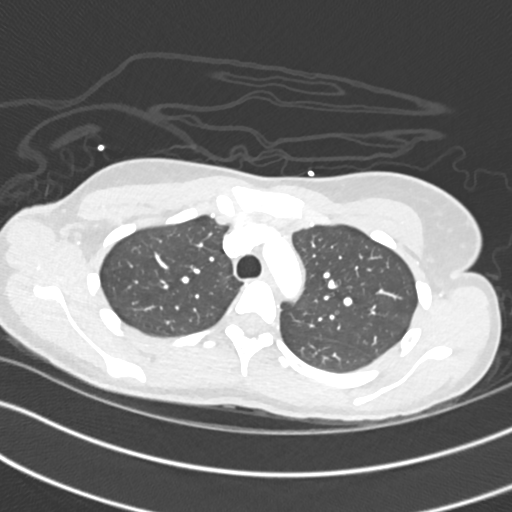
[im 226/291  mediastinal]
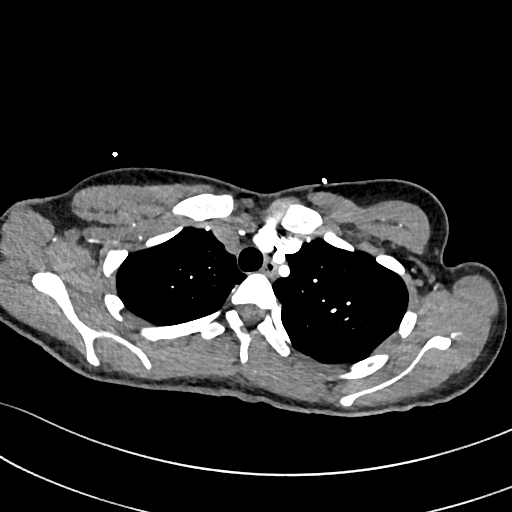
[im 242/291  lung]
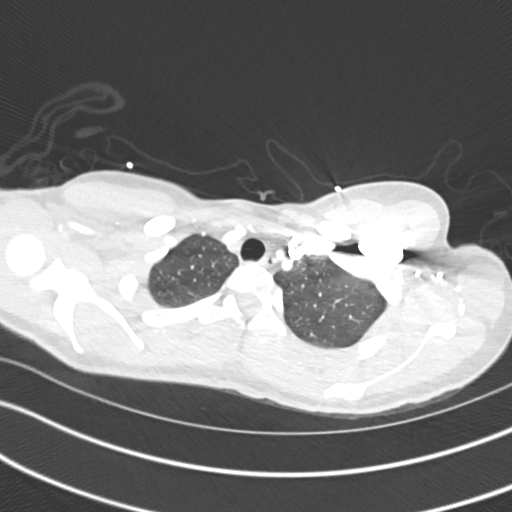
[im 258/291  mediastinal]
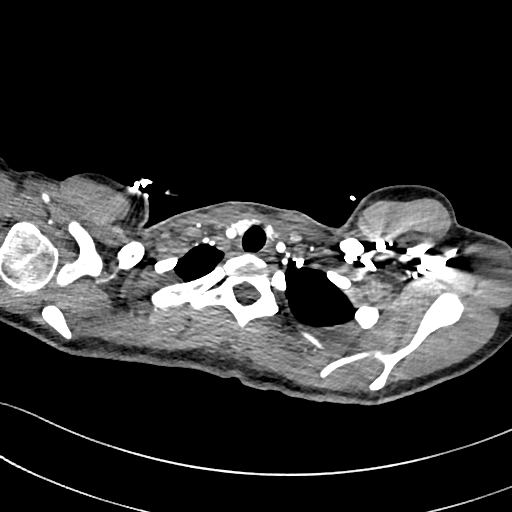
[im 274/291  lung]
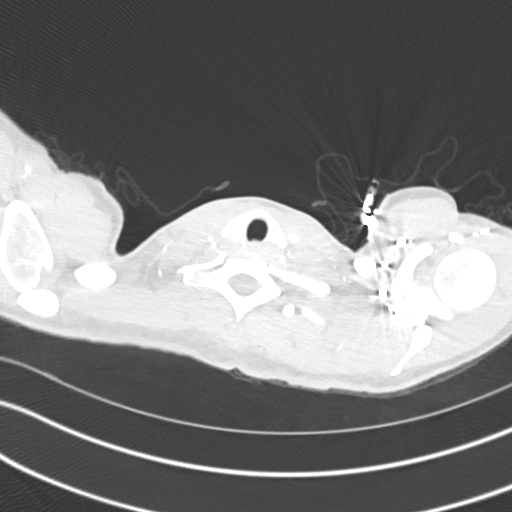

[Series 9: pe 2mm cor · coronal · 0.59mm/px · 1 of 90 slices shown]
[im 45/90  mediastinal]
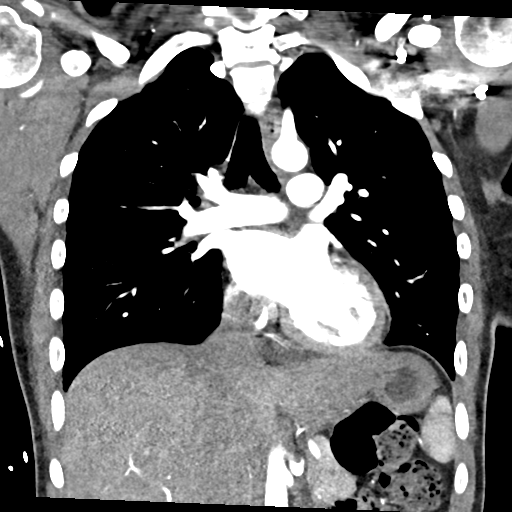

[18 of 36 positions shown; findings below may reference images not displayed]

FINDINGS: Cardiovascular: Satisfactory opacification of the pulmonary arteries
to the segmental level. No evidence of pulmonary embolism. Normal
heart size. No pericardial effusion.

Mediastinum/Nodes: No mass or adenopathy.

Lungs/Pleura: There is no edema, consolidation, effusion, or
pneumothorax. Rare areas of air trapping seen at the bases.

Upper Abdomen: Negative

Musculoskeletal: Moderate thoracic dextroscoliosis. No acute or
aggressive finding

Review of the MIP images confirms the above findings.
IMPRESSION: 1. Negative for pulmonary embolism. Normal heart size and no
pericardial effusion.
2. Moderate scoliosis.
